# Patient Record
Sex: Female | Born: 1985 | Race: Black or African American | Hispanic: No | Marital: Single | Smoking: Never smoker
Health system: Southern US, Community
[De-identification: ages and names within clinical notes are randomized; demographics above are authoritative.]

## PROBLEM LIST (undated history)

## (undated) ENCOUNTER — Emergency Department: Discharge status: Absent without leave | Payer: 59

---

## 2007-12-04 HISTORY — PX: OTHER SURGICAL HISTORY: SHX169

## 2011-04-09 ENCOUNTER — Inpatient Hospital Stay (INDEPENDENT_AMBULATORY_CARE_PROVIDER_SITE_OTHER)
Admission: RE | Admit: 2011-04-09 | Discharge: 2011-04-09 | Disposition: A | Discharge status: Home / Self Care | Payer: 59 | Source: Ambulatory Visit | Attending: Emergency Medicine | Admitting: Emergency Medicine

## 2011-04-09 DIAGNOSIS — R42 Dizziness and giddiness: Secondary | ICD-10-CM

## 2011-04-09 DIAGNOSIS — E860 Dehydration: Secondary | ICD-10-CM

## 2011-04-09 LAB — POCT I-STAT, CHEM 8
BUN: 9 mg/dL (ref 6–23)
Chloride: 104 mEq/L (ref 96–112)
Creatinine, Ser: 0.7 mg/dL (ref 0.4–1.2)
Sodium: 139 mEq/L (ref 135–145)

## 2011-04-09 LAB — POCT URINALYSIS DIP (DEVICE)
Glucose, UA: NEGATIVE mg/dL
Hgb urine dipstick: NEGATIVE
Specific Gravity, Urine: >=1.03 (ref 1.005–1.030)

## 2011-04-09 LAB — POCT PREGNANCY, URINE: Preg Test, Ur: NEGATIVE

## 2015-10-23 ENCOUNTER — Encounter: Payer: Self-pay | Admitting: *Deleted

## 2015-10-23 ENCOUNTER — Emergency Department
Admission: EM | Admit: 2015-10-23 | Discharge: 2015-10-23 | Disposition: A | Discharge status: Home / Self Care | Payer: 59 | Source: Home / Self Care | Attending: Emergency Medicine | Admitting: Emergency Medicine

## 2015-10-23 DIAGNOSIS — N760 Acute vaginitis: Secondary | ICD-10-CM

## 2015-10-23 DIAGNOSIS — A499 Bacterial infection, unspecified: Secondary | ICD-10-CM

## 2015-10-23 DIAGNOSIS — B9689 Other specified bacterial agents as the cause of diseases classified elsewhere: Secondary | ICD-10-CM

## 2015-10-23 MED ORDER — METRONIDAZOLE 500 MG PO TABS: 500.0000 mg | ORAL_TABLET | Freq: Two times a day (BID) | ORAL | Status: AC

## 2015-10-23 NOTE — ED Provider Notes (Signed)
CSN: [id number]     Arrival date & time 10/23/15  1100 History   First MD Initiated Contact with Patient 10/23/15 1126     Chief Complaint  Patient presents with  . Vaginal Discharge   (Consider location/radiation/quality/duration/timing/severity/associated sxs/prior Treatment) Patient is a 29 y.o. female presenting with vaginal discharge. The history is provided by the patient. No language interpreter was used.  Vaginal Discharge Quality:  White Severity:  Moderate Onset quality:  Gradual Duration:  10 weeks Timing:  Constant Progression:  Worsening Chronicity:  New Context: after intercourse   Relieved by:  Nothing Worsened by:  Nothing tried Ineffective treatments:  None tried Associated symptoms: no abdominal pain, no dysuria, no fever, no genital lesions, no urinary frequency, no urinary hesitancy, no urinary incontinence and no vaginal itching   Risk factors: new sexual partner and STI   Pt reports discharge and odor. Pt has a new partner.  No known std risk  History reviewed. No pertinent past medical history. Past Surgical History  Procedure Laterality Date  . Laproscopy  2009   History reviewed. No pertinent family history. Social History  Substance Use Topics  . Smoking status: Never Smoker   . Smokeless tobacco: Never Used  . Alcohol Use: No   OB History    No data available     Review of Systems  Constitutional: Negative for fever.  Gastrointestinal: Negative for abdominal pain.  Genitourinary: Positive for vaginal discharge. Negative for bladder incontinence, dysuria and hesitancy.  All other systems reviewed and are negative.   Allergies  Review of patient's allergies indicates no known allergies.  Home Medications   Prior to Admission medications   Medication Sig Start Date End Date Taking? Authorizing Provider  metroNIDAZOLE (FLAGYL) 500 MG tablet Take 1 tablet (500 mg total) by mouth 2 (two) times daily. 10/23/15   Elson Areas, PA-C   Meds  Ordered and Administered this Visit  Medications - No data to display  BP 127/78 mmHg  Pulse 98  Temp(Src) 98.3 F (36.8 C) (Oral)  Ht  (1.702 m)  Wt 170 lb (77.111 kg)  BMI 26.62 kg/m2  SpO2 100%  LMP 10/13/2015 No data found.   Physical Exam  Constitutional: She appears well-developed and well-nourished.  HENT:  Head: Normocephalic and atraumatic.  Eyes: Conjunctivae are normal. Pupils are equal, round, and reactive to light.  Neck: Normal range of motion. Neck supple.  Cardiovascular: Normal rate.   Abdominal: Soft. There is no tenderness.  Genitourinary: Vaginal discharge found.  Vaginal discharge,  Thick white,  Adnexa no masses,  Cervix nontender   Musculoskeletal: Normal range of motion.  Skin: Skin is warm.    ED Course  Procedures (including critical care time)  Labs Review Labs Reviewed  GC/CHLAMYDIA PROBE AMP, GENITAL    Imaging Review No results found.   Visual Acuity Review  Right Eye Distance:   Left Eye Distance:   Bilateral Distance:    Right Eye Near:   Left Eye Near:    Bilateral Near:         MDM wet prep by me shows moderate clue cells. I do not see yeast.   I doubt GC or chlamydia.  I will treat with flagyl.  Pt counseled on pending results.   1. BV (bacterial vaginosis)    An After Visit Summary was printed and given to the patient. Meds ordered this encounter  Medications  . metroNIDAZOLE (FLAGYL) 500 MG tablet    Sig: Take 1 tablet (  500 mg total) by mouth 2 (two) times daily.    Dispense:  14 tablet    Refill:  0    Order Specific Question:  Supervising Provider    Answer:  Georgina PillionMASSEY, DAVID [[id number]]     Elson AreasLeslie K Garlene Apperson, PA-C 10/23/15 1538

## 2015-10-23 NOTE — Discharge Instructions (Signed)

## 2015-10-23 NOTE — ED Notes (Signed)
Pt had a new partner starting 2 1/2 months ago.  After intercourse she notices a fishy smell and has a white discharge.  She denies any itching or burning.

## 2015-10-26 ENCOUNTER — Telehealth: Payer: Self-pay

## 2015-10-26 LAB — GC/CHLAMYDIA PROBE AMP
CT PROBE, AMP APTIMA: POSITIVE — AB
GC PROBE AMP APTIMA: NEGATIVE

## 2015-10-31 ENCOUNTER — Other Ambulatory Visit: Payer: Self-pay | Admitting: Occupational Medicine

## 2015-10-31 ENCOUNTER — Ambulatory Visit
Admission: RE | Admit: 2015-10-31 | Discharge: 2015-10-31 | Disposition: A | Discharge status: Home / Self Care | Payer: No Typology Code available for payment source | Source: Ambulatory Visit | Attending: Infectious Disease | Admitting: Infectious Disease

## 2015-10-31 ENCOUNTER — Other Ambulatory Visit: Payer: Self-pay | Admitting: Infectious Disease

## 2015-10-31 ENCOUNTER — Ambulatory Visit (HOSPITAL_COMMUNITY)
Admission: RE | Admit: 2015-10-31 | Discharge: 2015-10-31 | Disposition: A | Discharge status: Home / Self Care | Payer: 59 | Source: Ambulatory Visit | Attending: Occupational Medicine | Admitting: Occupational Medicine

## 2015-10-31 DIAGNOSIS — R7612 Nonspecific reaction to cell mediated immunity measurement of gamma interferon antigen response without active tuberculosis: Secondary | ICD-10-CM

## 2015-10-31 DIAGNOSIS — R7611 Nonspecific reaction to tuberculin skin test without active tuberculosis: Secondary | ICD-10-CM

## 2015-10-31 DIAGNOSIS — A150 Tuberculosis of lung: Secondary | ICD-10-CM

## 2016-06-09 IMAGING — DX DG CHEST 1V
1 series · 1 of 1 positions shown · non-contrast
Comparison: None available

CLINICAL DATA: Positive for QFG TB test.

EXAM:
CHEST 1 VIEW

[chest pa]
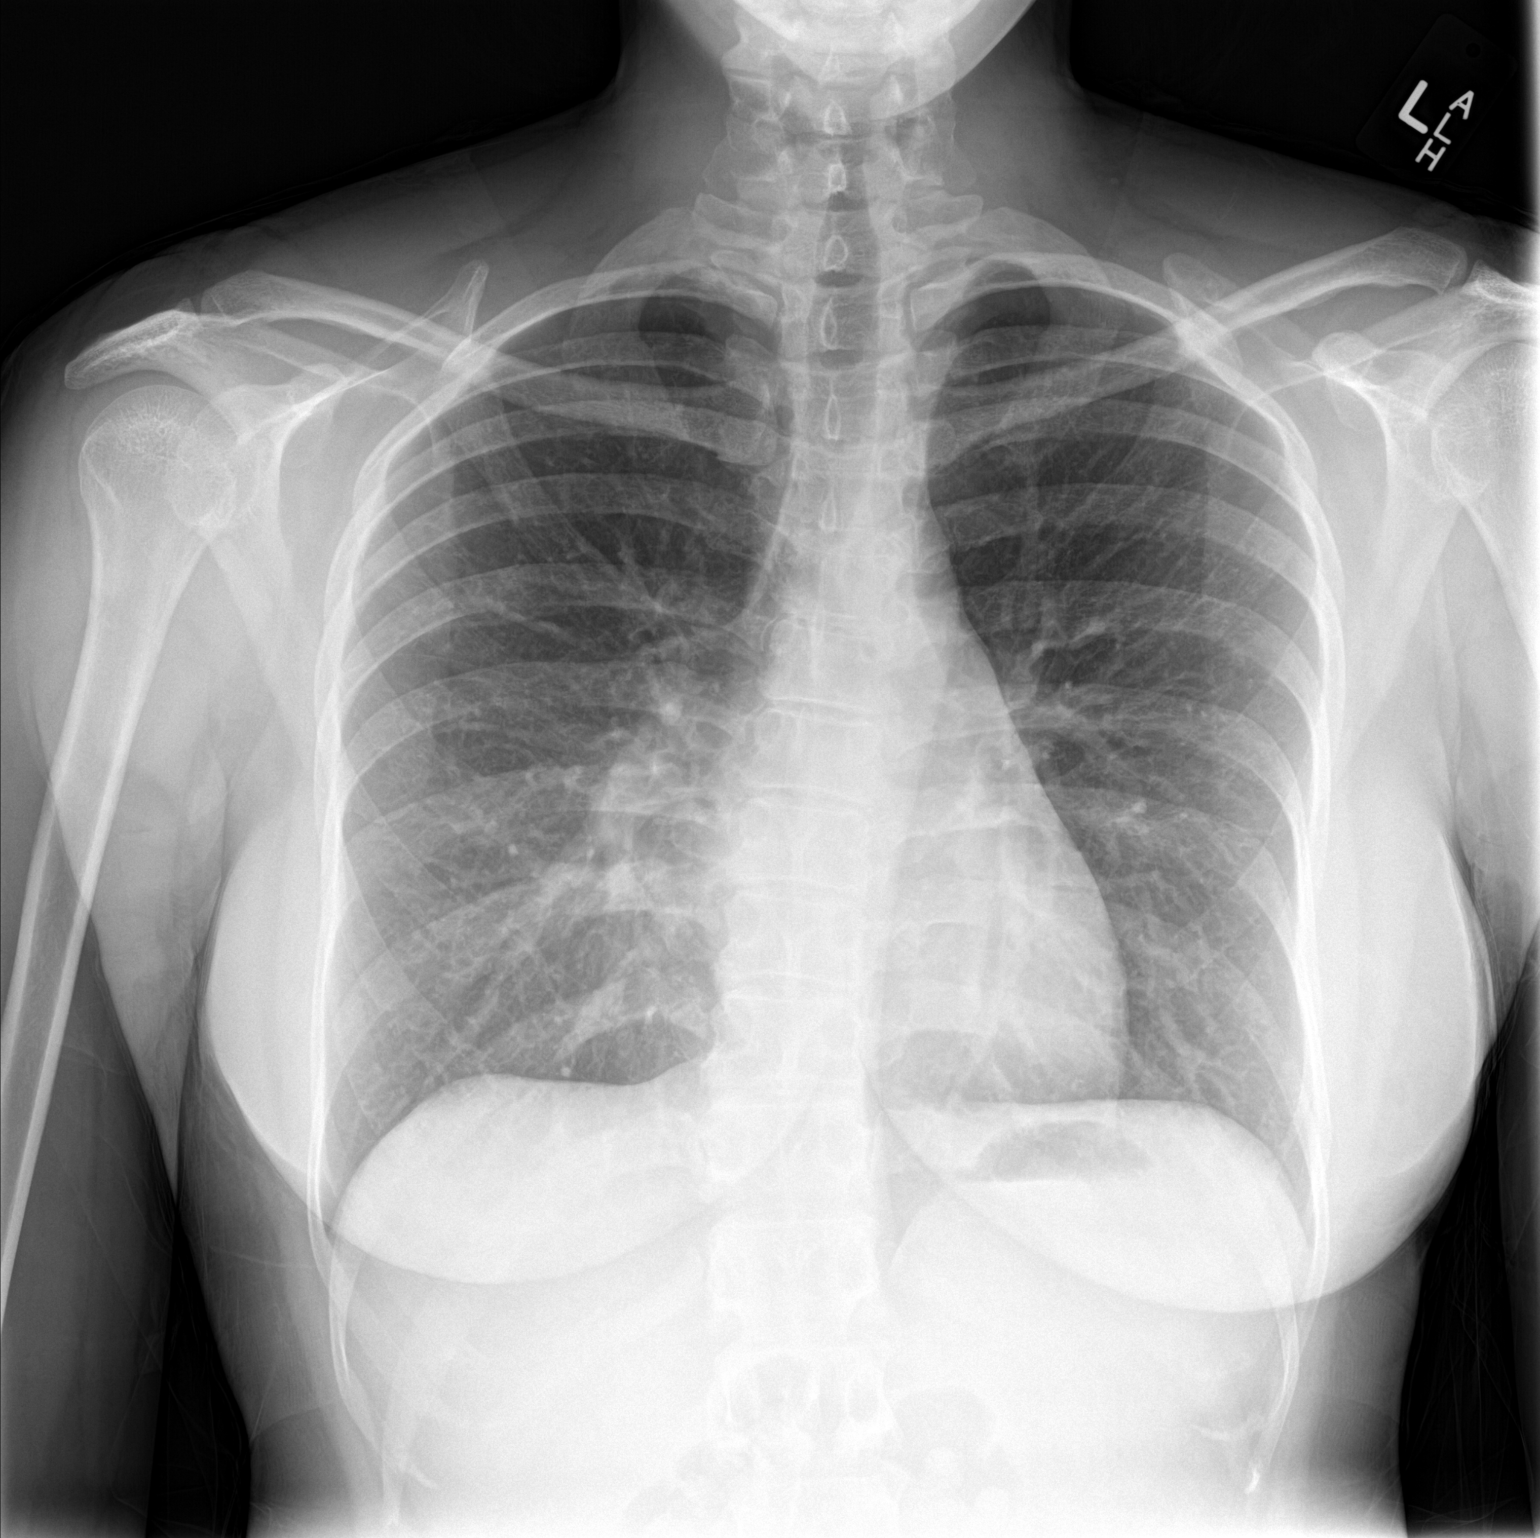

[1 of 1 positions shown; findings below may reference images not displayed]

FINDINGS: There is opacity adjacent to the right heart margin, obscuring its
border. I suspect that the findings are related to mild pectus
deformity. However, a lateral view is necessary to exclude right
middle lobe abnormality. The left lung is clear. There is no
evidence for pulmonary edema.
IMPRESSION: 1. Right middle lobe abnormality versus pectus deformity.
2. Lateral view of the chest is recommended.
These results will be called to the ordering clinician or
representative by the Radiologist Assistant, and communication
documented in the PACS or zVision Dashboard.

## 2016-06-09 IMAGING — CR DG CHEST 1V
1 series · 1 of 1 positions shown · non-contrast
Comparison: Frontal radiograph of earlier today

CLINICAL DATA: Positive TB test. Possible pectus excavatum
deformity.

EXAM:
CHEST 1 VIEW

[chest ap]
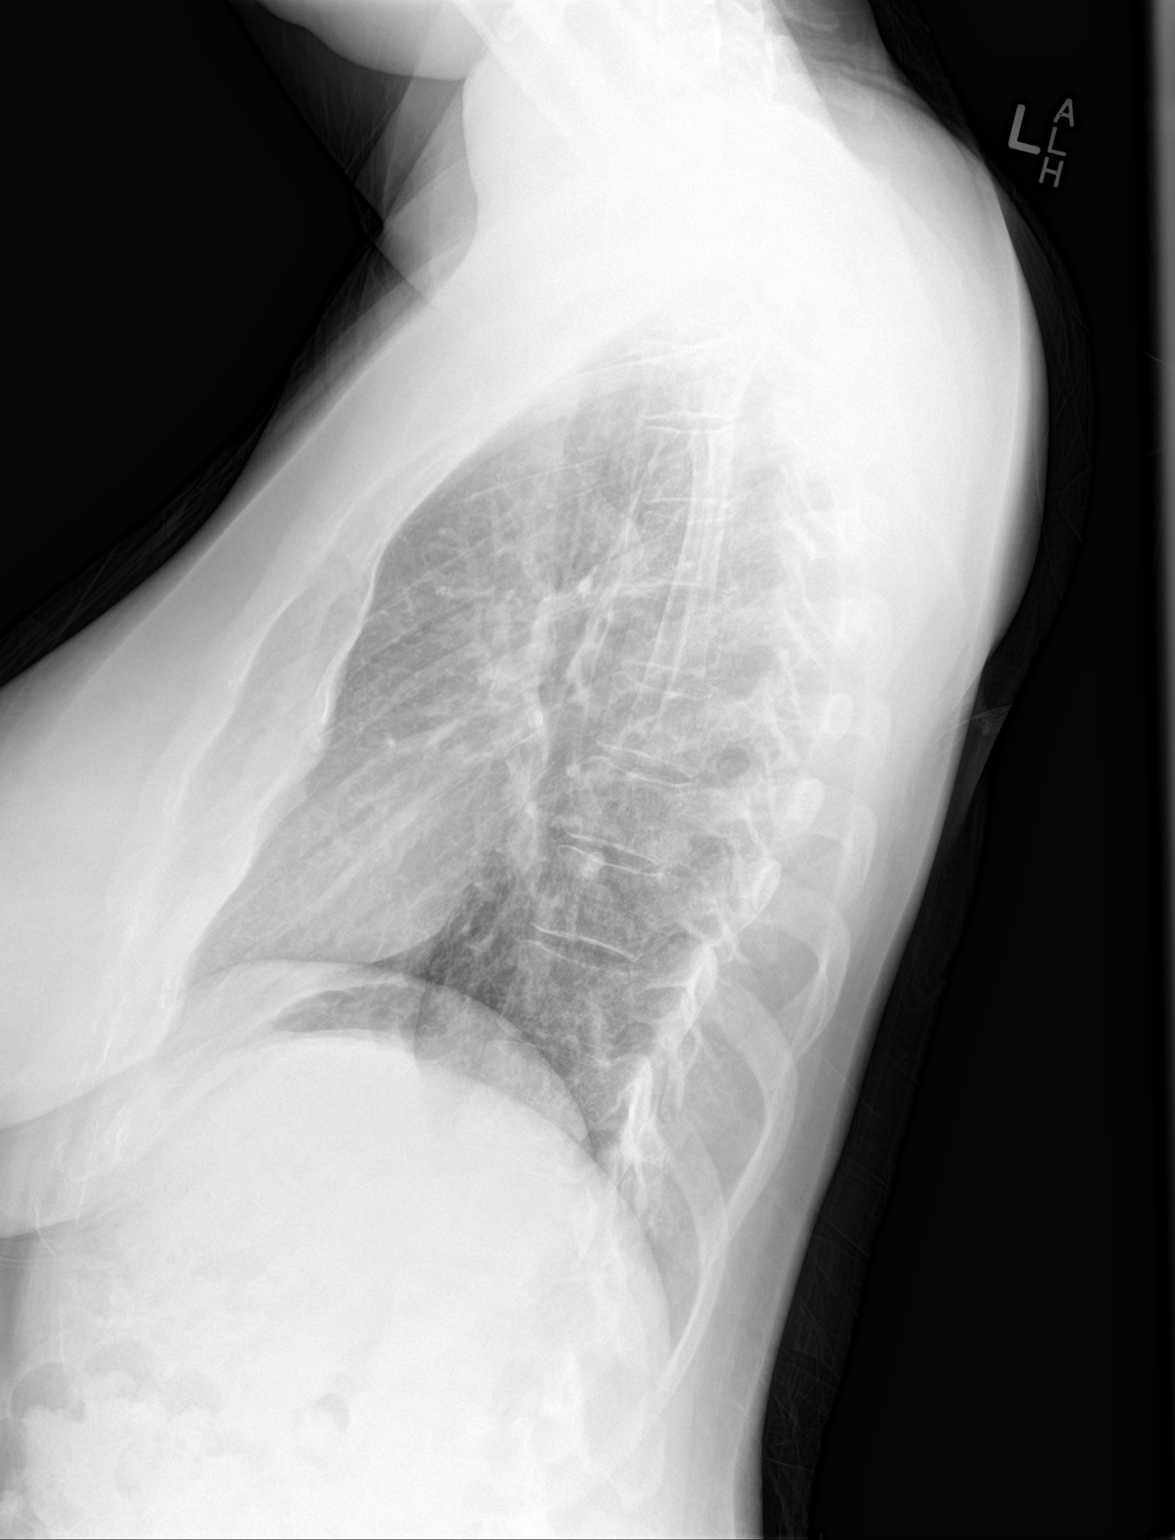

[1 of 1 positions shown; findings below may reference images not displayed]

FINDINGS: 2333 hours. This confirms a mild to moderate pectus excavatum
deformity. this is superimposed upon obliquity on the lateral view.
Combination of pectus deformity and convex right thoracolumbar spine
curvature likely accounts for the radiographic abnormality along the
right heart border on the frontal. No pleural effusion or
pneumothorax. No lobar consolidation.
IMPRESSION: Pectus excavatum deformity, likely accounting for the plain film
abnormality on the frontal radiograph.
